# Patient Record
Sex: Male | Born: 1980 | Race: White | Hispanic: Yes | Marital: Married | Smoking: Current some day smoker
Health system: Southern US, Community
[De-identification: ages and names within clinical notes are randomized; demographics above are authoritative.]

## PROBLEM LIST (undated history)

## (undated) DIAGNOSIS — E785 Hyperlipidemia, unspecified: Secondary | ICD-10-CM

## (undated) HISTORY — DX: Hyperlipidemia, unspecified: E78.5

---
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
## 2005-05-19 ENCOUNTER — Emergency Department (HOSPITAL_COMMUNITY): Admission: EM | Admit: 2005-05-19 | Discharge: 2005-05-19 | Payer: Self-pay | Admitting: Emergency Medicine

## 2005-10-25 ENCOUNTER — Emergency Department (HOSPITAL_COMMUNITY): Admission: EM | Admit: 2005-10-25 | Discharge: 2005-10-25 | Payer: Self-pay | Admitting: Emergency Medicine

## 2005-12-25 ENCOUNTER — Emergency Department (HOSPITAL_COMMUNITY): Admission: EM | Admit: 2005-12-25 | Discharge: 2005-12-25 | Payer: Self-pay | Admitting: Family Medicine

## 2006-01-31 ENCOUNTER — Emergency Department (HOSPITAL_COMMUNITY): Admission: EM | Admit: 2006-01-31 | Discharge: 2006-02-01 | Payer: Self-pay | Admitting: Emergency Medicine

## 2006-02-02 ENCOUNTER — Emergency Department (HOSPITAL_COMMUNITY): Admission: EM | Admit: 2006-02-02 | Discharge: 2006-02-02 | Payer: Self-pay | Admitting: Emergency Medicine

## 2006-03-27 ENCOUNTER — Emergency Department (HOSPITAL_COMMUNITY): Admission: EM | Admit: 2006-03-27 | Discharge: 2006-03-27 | Payer: Self-pay | Admitting: Emergency Medicine

## 2007-01-06 ENCOUNTER — Emergency Department (HOSPITAL_COMMUNITY): Admission: EM | Admit: 2007-01-06 | Discharge: 2007-01-06 | Payer: Self-pay | Admitting: Emergency Medicine

## 2007-02-09 ENCOUNTER — Emergency Department (HOSPITAL_COMMUNITY): Admission: EM | Admit: 2007-02-09 | Discharge: 2007-02-09 | Payer: Self-pay | Admitting: Family Medicine

## 2007-03-11 ENCOUNTER — Emergency Department (HOSPITAL_COMMUNITY): Admission: EM | Admit: 2007-03-11 | Discharge: 2007-03-11 | Payer: Self-pay | Admitting: Emergency Medicine

## 2007-03-15 ENCOUNTER — Emergency Department (HOSPITAL_COMMUNITY): Admission: EM | Admit: 2007-03-15 | Discharge: 2007-03-15 | Payer: Self-pay | Admitting: Family Medicine

## 2007-06-06 ENCOUNTER — Emergency Department (HOSPITAL_COMMUNITY): Admission: EM | Admit: 2007-06-06 | Discharge: 2007-06-06 | Payer: Self-pay | Admitting: Emergency Medicine

## 2008-02-21 ENCOUNTER — Emergency Department (HOSPITAL_COMMUNITY): Admission: EM | Admit: 2008-02-21 | Discharge: 2008-02-21 | Payer: Self-pay | Admitting: Emergency Medicine

## 2008-08-12 ENCOUNTER — Emergency Department (HOSPITAL_COMMUNITY): Admission: EM | Admit: 2008-08-12 | Discharge: 2008-08-12 | Payer: Self-pay | Admitting: Emergency Medicine

## 2009-10-05 ENCOUNTER — Emergency Department (HOSPITAL_COMMUNITY): Admission: EM | Admit: 2009-10-05 | Discharge: 2009-10-05 | Payer: Self-pay | Admitting: Internal Medicine

## 2009-12-20 ENCOUNTER — Emergency Department (HOSPITAL_COMMUNITY): Admission: EM | Admit: 2009-12-20 | Discharge: 2009-12-20 | Payer: Self-pay | Admitting: Emergency Medicine

## 2011-01-19 IMAGING — CR DG KNEE COMPLETE 4+V*R*
4 series · 4 of 4 positions shown · non-contrast
Comparison: None.

CLINICAL DATA: 29-year-old male with twisting right knee injury
yesterday.  Medial pain.

RIGHT KNEE - COMPLETE 4+ VIEW

[t knee ap right]
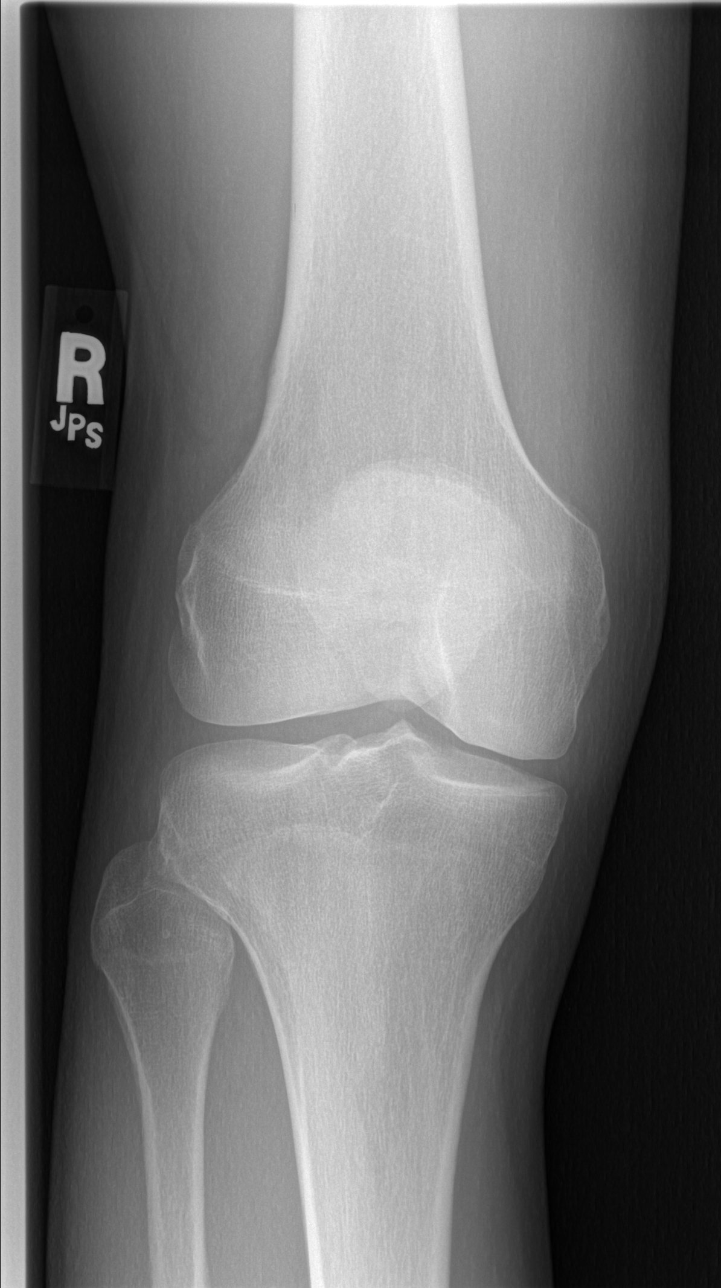

[t knee oblique right (1 of 2)]
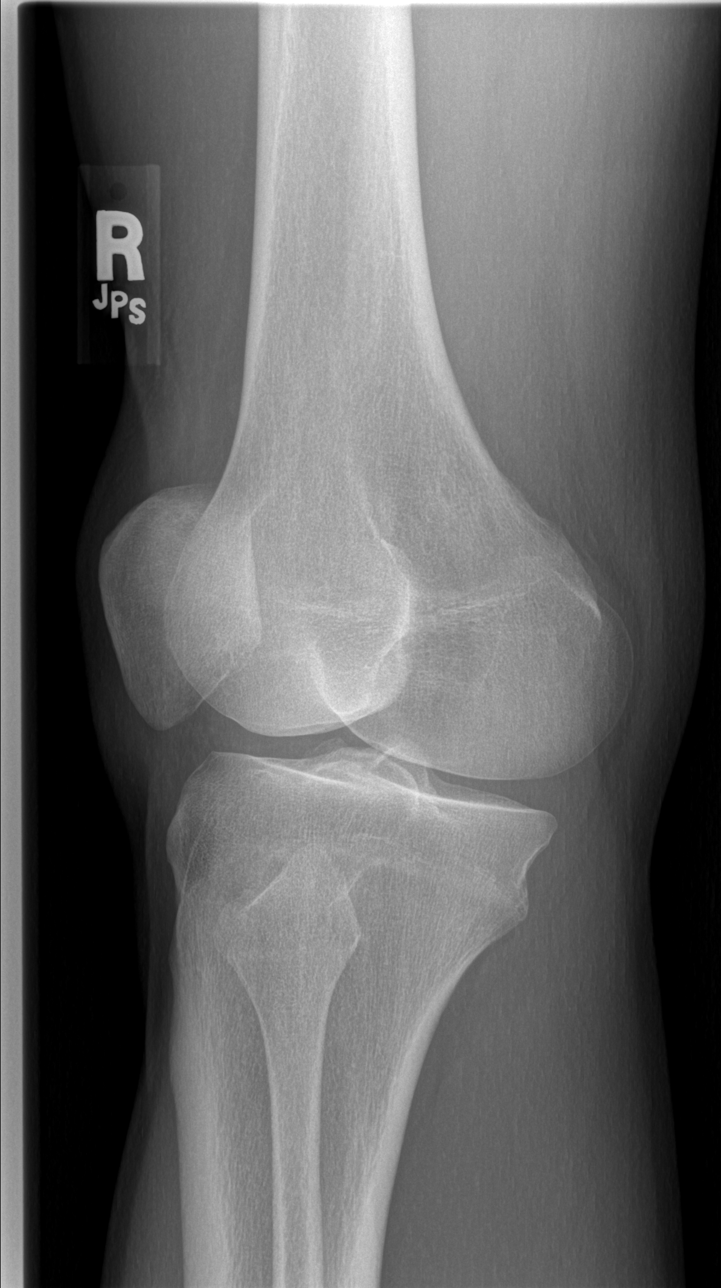

[t knee oblique right (2 of 2)]
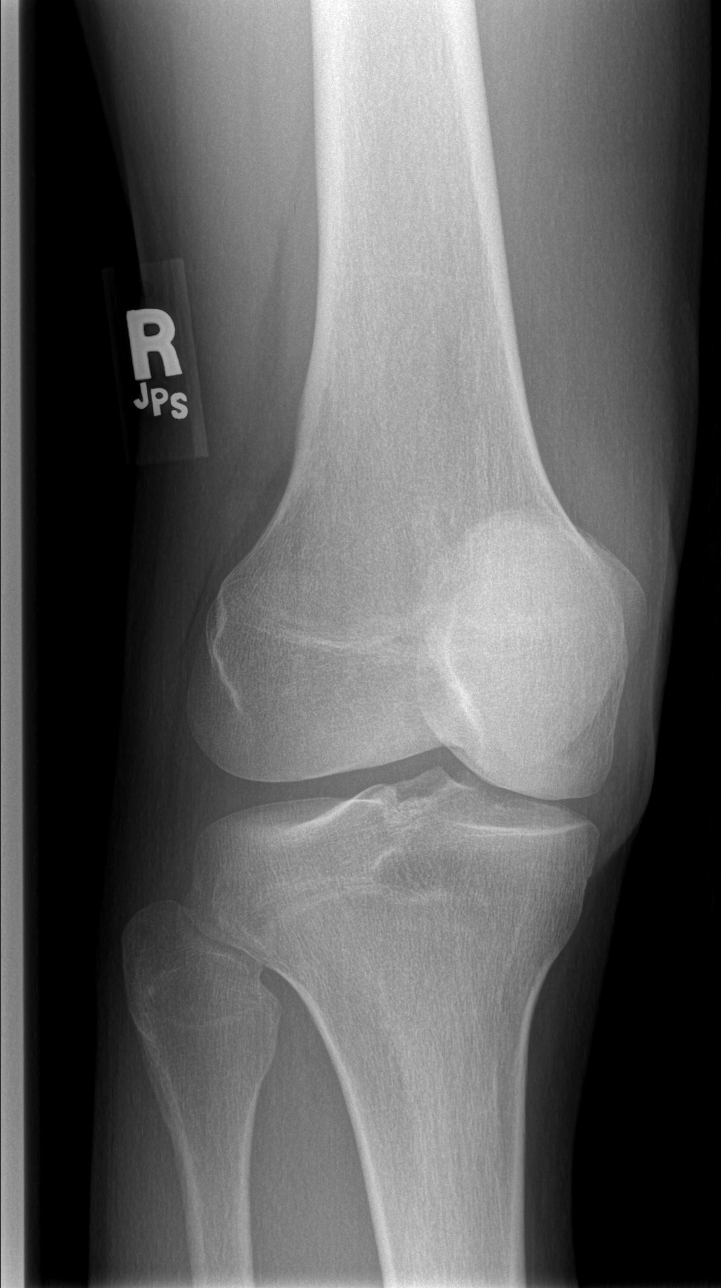

[t knee lat right]
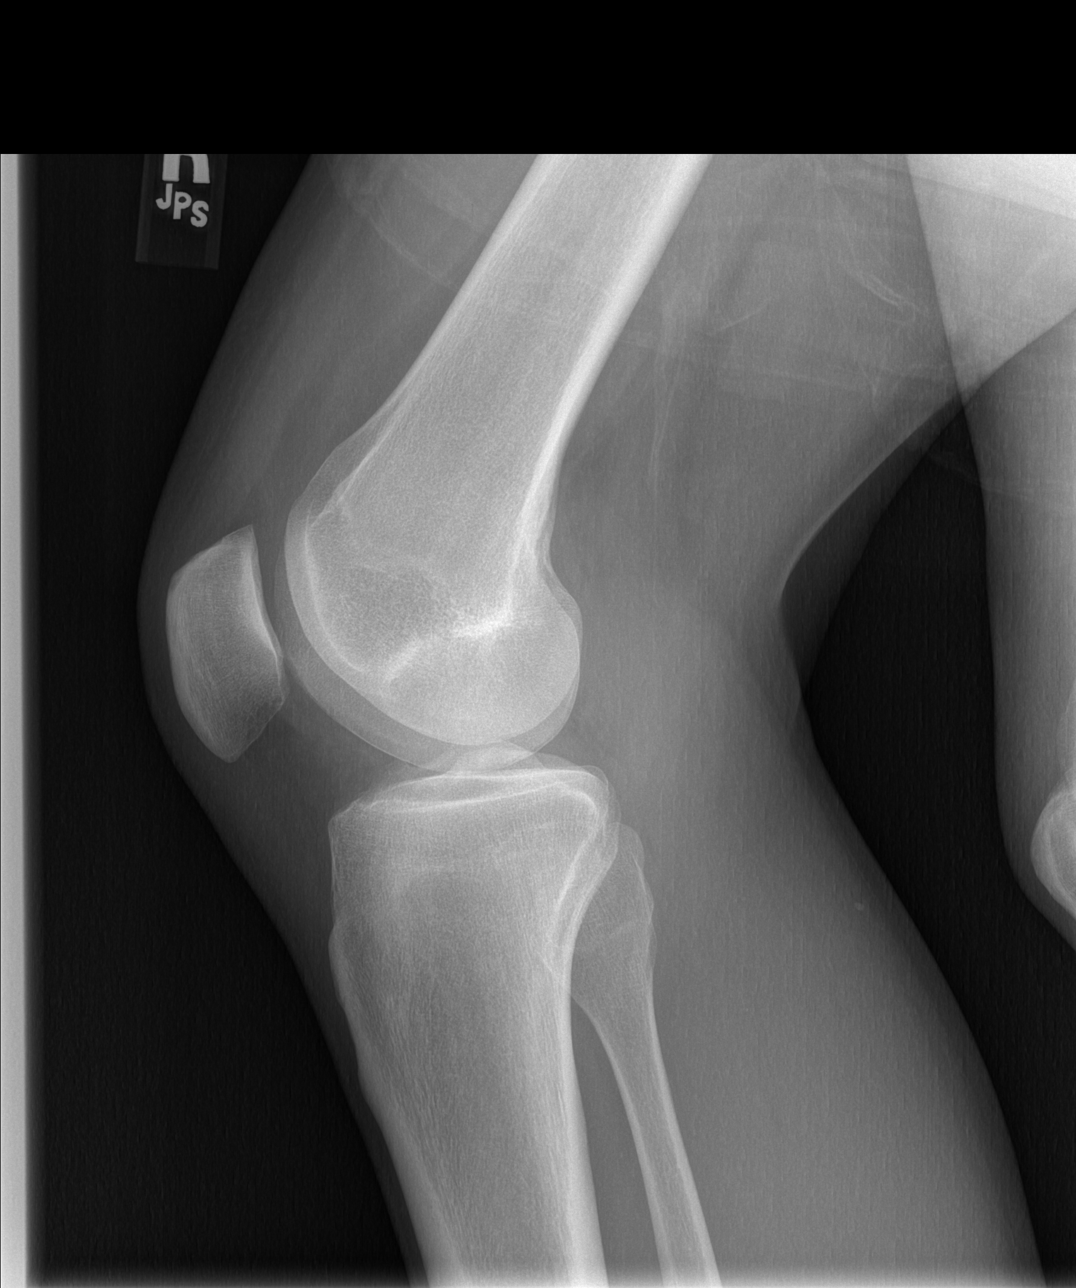

[4 of 4 positions shown; findings below may reference images not displayed]

FINDINGS: Trace suprapatellar effusion. Bone mineralization is
within normal limits. Normal joint spaces.  No acute fracture.
IMPRESSION: Trace joint effusion.  No acute fracture or dislocation identified
about the right knee.

## 2018-04-22 ENCOUNTER — Ambulatory Visit: Payer: Self-pay | Admitting: Family Medicine

## 2018-04-23 ENCOUNTER — Telehealth: Payer: Self-pay | Admitting: Family Medicine

## 2018-04-23 ENCOUNTER — Ambulatory Visit: Payer: BC Managed Care – PPO | Admitting: Family Medicine

## 2018-04-23 ENCOUNTER — Encounter: Payer: Self-pay | Admitting: Family Medicine

## 2018-04-23 VITALS — BP 114/72 | HR 71 | Temp 98.4°F | Ht 75.0 in | Wt 228.5 lb

## 2018-04-23 DIAGNOSIS — Z13220 Encounter for screening for lipoid disorders: Secondary | ICD-10-CM | POA: Diagnosis not present

## 2018-04-23 DIAGNOSIS — Z131 Encounter for screening for diabetes mellitus: Secondary | ICD-10-CM

## 2018-04-23 DIAGNOSIS — Z720 Tobacco use: Secondary | ICD-10-CM

## 2018-04-23 DIAGNOSIS — S335XXA Sprain of ligaments of lumbar spine, initial encounter: Secondary | ICD-10-CM

## 2018-04-23 DIAGNOSIS — Z833 Family history of diabetes mellitus: Secondary | ICD-10-CM

## 2018-04-23 DIAGNOSIS — F4321 Adjustment disorder with depressed mood: Secondary | ICD-10-CM | POA: Diagnosis not present

## 2018-04-23 DIAGNOSIS — Z807 Family history of other malignant neoplasms of lymphoid, hematopoietic and related tissues: Secondary | ICD-10-CM | POA: Diagnosis not present

## 2018-04-23 DIAGNOSIS — F4320 Adjustment disorder, unspecified: Secondary | ICD-10-CM | POA: Insufficient documentation

## 2018-04-23 DIAGNOSIS — Z83438 Family history of other disorder of lipoprotein metabolism and other lipidemia: Secondary | ICD-10-CM

## 2018-04-23 LAB — CBC WITH DIFFERENTIAL/PLATELET
BASOS ABS: 0.1 10*3/uL (ref 0.0–0.1)
Basophils Relative: 0.8 % (ref 0.0–3.0)
Eosinophils Absolute: 0.3 10*3/uL (ref 0.0–0.7)
Eosinophils Relative: 4 % (ref 0.0–5.0)
HEMATOCRIT: 40.1 % (ref 39.0–52.0)
Hemoglobin: 13.4 g/dL (ref 13.0–17.0)
LYMPHS PCT: 23.8 % (ref 12.0–46.0)
Lymphs Abs: 1.9 10*3/uL (ref 0.7–4.0)
MCHC: 33.4 g/dL (ref 30.0–36.0)
MCV: 94.4 fl (ref 78.0–100.0)
MONOS PCT: 9.9 % (ref 3.0–12.0)
Monocytes Absolute: 0.8 10*3/uL (ref 0.1–1.0)
NEUTROS ABS: 4.8 10*3/uL (ref 1.4–7.7)
Neutrophils Relative %: 61.5 % (ref 43.0–77.0)
Platelets: 339 10*3/uL (ref 150.0–400.0)
RBC: 4.25 Mil/uL (ref 4.22–5.81)
RDW: 13.2 % (ref 11.5–15.5)
WBC: 7.9 10*3/uL (ref 4.0–10.5)

## 2018-04-23 LAB — LIPID PANEL
Cholesterol: 259 mg/dL — ABNORMAL HIGH (ref 0–200)
HDL: 55.5 mg/dL (ref >39.00)
NONHDL: 203.99
TRIGLYCERIDES: 208 mg/dL — AB (ref 0.0–149.0)
Total CHOL/HDL Ratio: 5
VLDL: 41.6 mg/dL — ABNORMAL HIGH (ref 0.0–40.0)

## 2018-04-23 LAB — LDL CHOLESTEROL, DIRECT: Direct LDL: 155 mg/dL

## 2018-04-23 LAB — HEMOGLOBIN A1C: HEMOGLOBIN A1C: 5.8 % (ref 4.6–6.5)

## 2018-04-23 NOTE — Progress Notes (Addendum)
Subjective:     Hector StallCarlos A Murphy is a 38 y.o. 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 happened about 1 or so weeks ago. he remembers picking up a box and maybe that has an impact.)     Back Pain  This is a new problem. The current episode started 1 to 4 weeks ago. The problem occurs intermittently. The problem has been gradually improving since onset. The pain is present in the lumbar spine. The quality of the pain is described as shooting and stabbing. The pain does not radiate. The pain is at a severity of 7/10. The symptoms are aggravated by position (lifting). Pertinent negatives include no abdominal pain, bladder incontinence, bowel incontinence, chest pain, fever, leg pain, numbness, paresis, tingling or weakness. He has tried NSAIDs, heat and ice (pain patch) for the symptoms. The treatment provided no relief.   Occurred when picking up a box - and pain was so severe he had to drop the item he was picking  #Grief reaction - lost his brother in September - exercising - is spending a lot of time with niece/nephew  - symptoms worse when not keeping busy - feels it is getting better overall   Review of Systems  Constitutional: Negative for chills and fever.  HENT: Negative.   Eyes: Negative.   Respiratory: Negative for cough, shortness of breath and wheezing.   Cardiovascular: Negative for chest pain.  Gastrointestinal: Negative for abdominal pain and bowel incontinence.  Endocrine: Negative.   Genitourinary: Negative.  Negative for bladder incontinence.  Musculoskeletal: Positive for back pain.  Skin: Negative.   Allergic/Immunologic: Negative.   Neurological: Negative for tingling, weakness and numbness.  Hematological: Negative.   Psychiatric/Behavioral: Negative.    PMH, PSH, FamHx and social hx reviewed and updated in Epic  Fmhx: diabetes in Mom and Dad  Social History     Tobacco Use  Smoking Status Current Some Day Smoker  . Packs/day: 0.25  . Years: 20.00  . Pack years: 5.00  . Types: Cigarettes  Smokeless Tobacco Never Used  Tobacco Comment   smokes about 2 to 3 cigarettes a day when he does smoke        Objective:    BP Readings from Last 3 Encounters:  04/23/18 114/72   Wt Readings from Last 3 Encounters:  04/23/18 228 lb 8 oz (103.6 kg)    BP 114/72   Pulse 71   Temp 98.4 F (36.9 C)   Ht 6\' 3"  (1.905 m)   Wt 228 lb 8 oz (103.6 kg)   SpO2 95%   BMI 28.56 kg/m    Physical Exam Constitutional:      Appearance: Normal appearance. He is not ill-appearing or diaphoretic.  HENT:     Right Ear: External ear normal.     Left Ear: External ear normal.     Nose: Nose normal.  Eyes:     General: No scleral icterus.    Extraocular Movements: Extraocular movements intact.     Conjunctiva/sclera: Conjunctivae normal.  Neck:     Musculoskeletal: Neck supple.  Cardiovascular:     Rate and Rhythm: Normal rate.  Pulmonary:     Effort: Pulmonary effort is normal.  Musculoskeletal:     Comments: Back:  Inspection: no erythema, no swelling Palpation: TTP along the L-spine, no paraspinous TTP  ROM: normal, though pain with flexion/extension and with lateral flexion Strength:  normal LE strength Neg straight leg raise  Skin:    General: Skin is warm and dry.  Neurological:     Mental Status: He is alert. Mental status is at baseline.     Sensory: No sensory deficit.     Motor: No weakness.     Deep Tendon Reflexes:     Reflex Scores:      Patellar reflexes are 2+ on the right side and 2+ on the left side.      Achilles reflexes are 2+ on the right side and 2+ on the left side. Psychiatric:        Mood and Affect: Mood normal.     Depression screen [facility] 2/9 04/23/2018  Decreased Interest 3  Down, Depressed, Hopeless 1  PHQ - 2 Score 4  Altered sleeping 3  Tired, decreased energy 2  Change in appetite 0  Feeling bad or  failure about yourself  2  Trouble concentrating 3  Moving slowly or fidgety/restless 2  Suicidal thoughts 0  PHQ-9 Score 16         Assessment & Plan:   Problem List Items Addressed This Visit      Musculoskeletal and Integument   Sprain, low back - Primary    Some tenderness over the L-spine but given only 1 week of symptoms discussed continued NSAIDs and home PT. Follow-up if not improved and will plan to do imaging in 6 weeks. Suggested PT given job, but pt declined        Other   Grief reaction    PHQ-9 elevated today. Though patient notes it is intermittent grief related to brothers sudden and fast death 2/2 to lymphoma. Using exercise and keeping busy to help with symptoms. Advised consideration for therapy but patient declined. Will continue to monitor.      Tobacco use    Smoking 3-5 cig/day. Not interested in quitting at this time.        Other Visit Diagnoses    Screening for hyperlipidemia       Relevant Orders   Lipid panel   Screening for diabetes mellitus       Relevant Orders   Hemoglobin A1c   Family history of lymphoma       Relevant Orders   CBC with Differential   Family history of diabetes mellitus (DM)       Family history of hyperlipidemia         Brother recently died from lymphoma which was discovered late. Discussed that this is less likely but given some anxiety and history of late cancer diagnosis will screen blood work to evaluate for blood pathology.    Return in about 5 weeks (around 05/28/2018) for if back not improved.  Lynnda Child, MD

## 2018-04-23 NOTE — Assessment & Plan Note (Signed)
PHQ-9 elevated today. Though patient notes it is intermittent grief related to brothers sudden and fast death 2/2 to lymphoma. Using exercise and keeping busy to help with symptoms. Advised consideration for therapy but patient declined. Will continue to monitor.

## 2018-04-23 NOTE — Patient Instructions (Addendum)
Low Back pain - Take Ibuprofen 800 mg up to 3 times a day instead of excedrin - Continue heat and ice - do rehab exercises below   Low Back Strain Rehab Ask your health care provider which exercises are safe for you. Do exercises exactly as told by your health care provider and adjust them as directed. It is normal to feel mild stretching, pulling, tightness, or discomfort as you do these exercises, but you should stop right away if you feel sudden pain or your pain gets worse. Do not begin these exercises until told by your health care provider. Stretching and range of motion exercises These exercises warm up your muscles and joints and improve the movement and flexibility of your back. These exercises also help to relieve pain, numbness, and tingling. Exercise A: Single knee to chest  1. Lie on your back on a firm surface with both legs straight. 2. Bend one of your knees. Use your hands to move your knee up toward your chest until you feel a gentle stretch in your lower back and buttock. ? Hold your leg in this position by holding onto the front of your knee. ? Keep your other leg as straight as possible. 3. Hold for __________ seconds. 4. Slowly return to the starting position. 5. Repeat with your other leg. Repeat __________ times. Complete this exercise __________ times a day. Exercise B: Prone extension on elbows  1. Lie on your abdomen on a firm surface. 2. Prop yourself up on your elbows. 3. Use your arms to help lift your chest up until you feel a gentle stretch in your abdomen and your lower back. ? This will place some of your body weight on your elbows. If this is uncomfortable, try stacking pillows under your chest. ? Your hips should stay down, against the surface that you are lying on. Keep your hip and back muscles relaxed. 4. Hold for __________ seconds. 5. Slowly relax your upper body and return to the starting position. Repeat __________ times. Complete this exercise  __________ times a day. Strengthening exercises These exercises build strength and endurance in your back. Endurance is the ability to use your muscles for a long time, even after they get tired. Exercise C: Pelvic tilt 1. Lie on your back on a firm surface. Bend your knees and keep your feet flat. 2. Tense your abdominal muscles. Tip your pelvis up toward the ceiling and flatten your lower back into the floor. ? To help with this exercise, you may place a small towel under your lower back and try to push your back into the towel. 3. Hold for __________ seconds. 4. Let your muscles relax completely before you repeat this exercise. Repeat __________ times. Complete this exercise __________ times a day. Exercise D: Alternating arm and leg raises  1. Get on your hands and knees on a firm surface. If you are on a hard floor, you may want to use padding to cushion your knees, such as an exercise mat. 2. Line up your arms and legs. Your hands should be below your shoulders, and your knees should be below your hips. 3. Lift your left leg behind you. At the same time, raise your right arm and straighten it in front of you. ? Do not lift your leg higher than your hip. ? Do not lift your arm higher than your shoulder. ? Keep your abdominal and back muscles tight. ? Keep your hips facing the ground. ? Do not arch your back. ?  Keep your balance carefully, and do not hold your breath. 4. Hold for __________ seconds. 5. Slowly return to the starting position and repeat with your right leg and your left arm. Repeat __________ times. Complete this exercise __________times a day. Exercise J: Single leg lower with bent knees 1. Lie on your back on a firm surface. 2. Tense your abdominal muscles and lift your feet off the floor, one foot at a time, so your knees and hips are bent in an "L" shape (at about 90 degrees). ? Your knees should be over your hips and your lower legs should be parallel to the  floor. 3. Keeping your abdominal muscles tense and your knee bent, slowly lower one of your legs so your toe touches the ground. 4. Lift your leg back up to return to the starting position. ? Do not hold your breath. ? Do not let your back arch. Keep your back flat against the ground. 5. Repeat with your other leg. Repeat __________ times. Complete this exercise __________ times a day. Posture and body mechanics  Body mechanics refers to the movements and positions of your body while you do your daily activities. Posture is part of body mechanics. Good posture and healthy body mechanics can help to relieve stress in your body's tissues and joints. Good posture means that your spine is in its natural S-curve position (your spine is neutral), your shoulders are pulled back slightly, and your head is not tipped forward. The following are general guidelines for applying improved posture and body mechanics to your everyday activities. Standing   When standing, keep your spine neutral and your feet about hip-width apart. Keep a slight bend in your knees. Your ears, shoulders, and hips should line up.  When you do a task in which you stand in one place for a long time, place one foot up on a stable object that is 2-4 inches (5-10 cm) high, such as a footstool. This helps keep your spine neutral. Sitting   When sitting, keep your spine neutral and keep your feet flat on the floor. Use a footrest, if necessary, and keep your thighs parallel to the floor. Avoid rounding your shoulders, and avoid tilting your head forward.  When working at a desk or a computer, keep your desk at a height where your hands are slightly lower than your elbows. Slide your chair under your desk so you are close enough to maintain good posture.  When working at a computer, place your monitor at a height where you are looking straight ahead and you do not have to tilt your head forward or downward to look at the  screen. Resting   When lying down and resting, avoid positions that are most painful for you.  If you have pain with activities such as sitting, bending, stooping, or squatting (flexion-based activities), lie in a position in which your body does not bend very much. For example, avoid curling up on your side with your arms and knees near your chest (fetal position).  If you have pain with activities such as standing for a long time or reaching with your arms (extension-based activities), lie with your spine in a neutral position and bend your knees slightly. Try the following positions: ? Lying on your side with a pillow between your knees. ? Lying on your back with a pillow under your knees. Lifting   When lifting objects, keep your feet at least shoulder-width apart and tighten your abdominal muscles.  Bend your  knees and hips and keep your spine neutral. It is important to lift using the strength of your legs, not your back. Do not lock your knees straight out.  Always ask for help to lift heavy or awkward objects. This information is not intended to replace advice given to you by your health care provider. Make sure you discuss any questions you have with your health care provider. Document Released: 03/06/2005 Document Revised: 11/11/2015 Document Reviewed: 12/16/2014 Elsevier Interactive Patient Education  2019 [employer].

## 2018-04-23 NOTE — Assessment & Plan Note (Signed)
Some tenderness over the L-spine but given only 1 week of symptoms discussed continued NSAIDs and home PT. Follow-up if not improved and will plan to do imaging in 6 weeks. Suggested PT given job, but pt declined

## 2018-04-23 NOTE — Assessment & Plan Note (Signed)
Smoking 3-5 cig/day. Not interested in quitting at this time.

## 2018-04-23 NOTE — Telephone Encounter (Signed)
Pt would like to be called with lab results and also put in my chart

## 2018-04-24 NOTE — Telephone Encounter (Signed)
Done, see result note 

## 2021-05-31 ENCOUNTER — Telehealth: Payer: Self-pay | Admitting: Family Medicine

## 2021-05-31 NOTE — Telephone Encounter (Signed)
This patient saw you as a new patient in 2.4.20, his wife currently sees you, Hector Murphy Case ? ?Can he be scheduled as an office visit (sooner) or does he need to go in a new patient slot? ? ?Please advise so we can call the patient to schedule ? ? ? ?

## 2021-05-31 NOTE — Telephone Encounter (Signed)
Does not need to wait for new patient visit.  Okay to schedule for follow-up. ?

## 2021-06-14 ENCOUNTER — Encounter: Payer: Self-pay | Admitting: *Deleted

## 2021-06-14 ENCOUNTER — Encounter: Payer: Self-pay | Admitting: Family Medicine

## 2021-06-14 ENCOUNTER — Other Ambulatory Visit: Payer: Self-pay

## 2021-06-14 ENCOUNTER — Ambulatory Visit (INDEPENDENT_AMBULATORY_CARE_PROVIDER_SITE_OTHER): Payer: BC Managed Care – PPO | Admitting: Family Medicine

## 2021-06-14 VITALS — BP 140/82 | HR 69 | Temp 97.4°F | Ht 76.0 in | Wt 200.1 lb

## 2021-06-14 DIAGNOSIS — Z131 Encounter for screening for diabetes mellitus: Secondary | ICD-10-CM

## 2021-06-14 DIAGNOSIS — Z3009 Encounter for other general counseling and advice on contraception: Secondary | ICD-10-CM

## 2021-06-14 DIAGNOSIS — Z720 Tobacco use: Secondary | ICD-10-CM

## 2021-06-14 DIAGNOSIS — G4709 Other insomnia: Secondary | ICD-10-CM | POA: Insufficient documentation

## 2021-06-14 DIAGNOSIS — N529 Male erectile dysfunction, unspecified: Secondary | ICD-10-CM

## 2021-06-14 DIAGNOSIS — E782 Mixed hyperlipidemia: Secondary | ICD-10-CM | POA: Insufficient documentation

## 2021-06-14 LAB — COMPREHENSIVE METABOLIC PANEL
ALT: 12 U/L (ref 0–53)
AST: 12 U/L (ref 0–37)
Albumin: 4.7 g/dL (ref 3.5–5.2)
Alkaline Phosphatase: 50 U/L (ref 39–117)
BUN: 19 mg/dL (ref 6–23)
CO2: 30 mEq/L (ref 19–32)
Calcium: 9.5 mg/dL (ref 8.4–10.5)
Chloride: 101 mEq/L (ref 96–112)
Creatinine, Ser: 0.86 mg/dL (ref 0.40–1.50)
GFR: 107.88 mL/min (ref >60.00)
Glucose, Bld: 100 mg/dL — ABNORMAL HIGH (ref 70–99)
Potassium: 4.6 mEq/L (ref 3.5–5.1)
Sodium: 139 mEq/L (ref 135–145)
Total Bilirubin: 0.3 mg/dL (ref 0.2–1.2)
Total Protein: 6.9 g/dL (ref 6.0–8.3)

## 2021-06-14 LAB — LDL CHOLESTEROL, DIRECT: Direct LDL: 140 mg/dL

## 2021-06-14 LAB — LIPID PANEL
Cholesterol: 264 mg/dL — ABNORMAL HIGH (ref 0–200)
HDL: 68.7 mg/dL (ref >39.00)
NonHDL: 195.5
Total CHOL/HDL Ratio: 4
Triglycerides: 207 mg/dL — ABNORMAL HIGH (ref 0.0–149.0)
VLDL: 41.4 mg/dL — ABNORMAL HIGH (ref 0.0–40.0)

## 2021-06-14 LAB — POCT GLYCOSYLATED HEMOGLOBIN (HGB A1C): Hemoglobin A1C: 5.1 % (ref 4.0–5.6)

## 2021-06-14 MED ORDER — SILDENAFIL CITRATE 50 MG PO TABS: 25.0000 mg | ORAL_TABLET | Freq: Every day | ORAL | 0 refills | Status: DC | PRN

## 2021-06-14 MED ORDER — HYDROXYZINE HCL 50 MG PO TABS: 50.0000 mg | ORAL_TABLET | Freq: Every evening | ORAL | 1 refills | Status: DC | PRN

## 2021-06-14 NOTE — Assessment & Plan Note (Signed)
Failing outpatient - melatonin and tylenol PM. Some mind-racing trigger. Sleep hygiene and trial of hydroxyzine.  ?

## 2021-06-14 NOTE — Assessment & Plan Note (Signed)
Discussed likely psychological - has 3 kids and very little time. But will trial viagra. Will also monitor BP at home. Discussed risks and ER precautions.  ?

## 2021-06-14 NOTE — Assessment & Plan Note (Signed)
Has quit drinking and lost weight. Re-check fasting today ?

## 2021-06-14 NOTE — Progress Notes (Signed)
? ?Subjective:  ? ?  ?Hector Murphy is a 41 y.o. male presenting for Erectile Dysfunction ?  ? ? ?HPI ? ?#Erectile dysfunction ?- smoking 5-6 cig/week ?- trouble getting an erection ?- started over the last year ?- has been with his partner for 10 years  ?- relationship still good - no marital stress ?- 3 children at home  ?- hard to get alone time - some urgency with intimacy ?- is getting morning erections ?- no hair loss ?- energy level is the same ?- no depression ?- no change in testicle size ? ?Still eating regularly ? 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 ?#insomnia ?- taking tylenol pm ?- melatonin only works for  ? ?Review of Systems ? ? ?Social History  ? ?Tobacco Use  ?Smoking Status Some Days  ? Packs/day: 0.25  ? Years: 20.00  ? Pack years: 5.00  ? Types: Cigarettes  ?Smokeless Tobacco Never  ?Tobacco Comments  ? smokes about 2 to 3 cigarettes a day when he does smoke  ? ? ? ?   ?Objective:  ?  ?BP Readings from Last 3 Encounters:  ?06/14/21 140/82  ?04/23/18 114/72  ? ?Wt Readings from Last 3 Encounters:  ?06/14/21 200 lb 2 oz (90.8 kg)  ?04/23/18 228 lb 8 oz (103.6 kg)  ? ? ?BP 140/82   Pulse 69   Temp (!) 97.4 ?F (36.3 ?C) (Oral)   Ht 6\' 4"  (1.93 m)   Wt 200 lb 2 oz (90.8 kg)   SpO2 99%   BMI 24.36 kg/m?  ? ? ?Physical Exam ?Constitutional:   ?   Appearance: Normal appearance. He is not ill-appearing or diaphoretic.  ?HENT:  ?   Right Ear: External ear normal.  ?   Left Ear: External ear normal.  ?   Nose: Nose normal.  ?Eyes:  ?   General: No scleral icterus. ?   Extraocular Movements: Extraocular movements intact.  ?   Conjunctiva/sclera: Conjunctivae normal.  ?Cardiovascular:  ?   Rate and Rhythm: Normal rate and regular rhythm.  ?   Heart sounds: No murmur heard. ?Pulmonary:  ?   Effort: Pulmonary effort is normal. No respiratory distress.  ?   Breath sounds: Normal breath sounds. No wheezing.  ?Musculoskeletal:  ?   Cervical back: Neck supple.  ?Skin: ?    General: Skin is warm and dry.  ?Neurological:  ?   Mental Status: He is alert. Mental status is at baseline.  ?Psychiatric:     ?   Mood and Affect: Mood normal.  ? ? ? ? ? ?   ?Assessment & Plan:  ? ?Problem List Items Addressed This Visit   ? ?  ? Other  ? Tobacco use  ?  Encouraged continued cessation.  ?  ?  ? Relevant Orders  ? Lipid panel  ? Comprehensive metabolic panel  ? Mixed hyperlipidemia  ?  Has quit drinking and lost weight. Re-check fasting today ?  ?  ? Relevant Medications  ? sildenafil (VIAGRA) 50 MG tablet  ? Other Relevant Orders  ? Lipid panel  ? Erectile dysfunction  ?  Discussed likely psychological - has 3 kids and very little time. But will trial viagra. Will also monitor BP at home. Discussed risks and ER precautions.  ?  ?  ? Relevant Medications  ? sildenafil (VIAGRA) 50 MG tablet  ? Other insomnia  ?  Failing outpatient - melatonin  and tylenol PM. Some mind-racing trigger. Sleep hygiene and trial of hydroxyzine.  ?  ?  ? ?Other Visit Diagnoses   ? ? Vasectomy evaluation    -  Primary  ? Relevant Orders  ? Ambulatory referral to Urology  ? Screening for diabetes mellitus      ? Relevant Orders  ? POCT glycosylated hemoglobin (Hb A1C) (Completed)  ? ?  ? ? ? ?Return in about 6 months (around 12/15/2021) for annual physical/blood pressure check. ? ?Lynnda Child, MD ? ?This visit occurred during the SARS-CoV-2 public health emergency.  Safety protocols were in place, including screening questions prior to the visit, additional usage of staff PPE, and extensive cleaning of exam room while observing appropriate contact time as indicated for disinfecting solutions.  ? ?

## 2021-06-14 NOTE — Assessment & Plan Note (Signed)
-   Encouraged continued cessation

## 2021-06-14 NOTE — Patient Instructions (Addendum)
Insomnia ?- Take hydroxyzine 50-100 mg at night ?- return in 1 month if not working ? ?Sleep hygiene checklist: ?1. Avoid naps during the day ?2. Avoid stimulants such as caffeine and nicotine. Avoid bedtime alcohol (it can speed onset of sleep but the body's metabolism can cause awakenings). At least 2 hours before bedtime ?3. All forms of exercise help ensure sound sleep - limit vigorous exercise to morning or late afternoon ?4. Avoid food too close to bedtime including chocolate (which contains caffeine) ?5. Soak up natural light ?6. Establish regular bedtime routine. ?7. Associate bed with sleep - avoid TV, computer or phone, reading while in bed. ?8. Ensure pleasant, relaxing sleep environment - quiet, dark, cool room. ? ?Good Sleep Hygiene Habits ?-- Got to bed and wake up within an hour of the same time every day ?-- Avoid bright screens (from laptop, phone, TV) within at least 30 minutes before bed. The "blue light" supresses the sleep hormone melatonin and the content may stimulate as well ?-- Maintain a quiet and dark sleep environment (blackout curtains, turn on a fan or white noise to block out disruptive sounds) ?-- Practicing relaxing activites before bed (taking a shower, reading a book, journaling, meditation app) ?-- To quiet a busy mind -- consider journaling before bed (jotting down reminders, worry thoughts, as well as positive things like a gratitude list) ? ? ?Begin a Mindfulness/Meditation practice -- this can take a little as 3 minutes ?-- You can find resources in books ?-- Or you can download apps like  ?---- Headspace App (which currently has free content called "Weathering the Storm") ?---- Calm (which has a few free options)  ?---- Insignt Timer ?---- Stop, Breathe & Think ? ?# With each of these Apps - you should decline the "start free trial" offer and as you search through the App should be able to access some of their free content. You can also chose to pay for the content if you  find one that works well for you.  ? ?# Many of them also offer sleep specific content which may help with insomnia ? ? ? ?Viagra ?- start with 25 mg ?- increase by 25 mg to effective dose ?- Check GoodRx for coupons ? ?Your blood pressure high.  ? ?High blood pressure increases your risk for heart attack and stroke.  ? ? ?Please check your blood pressure 2-4 times a week.  ? ?To check your blood pressure ?1) Sit in a quiet and relaxed place for 5 minutes ?2) Make sure your feet are flat on the ground ?3) Consider checking first thing in the morning  ? ?Normal blood pressure is less than 140/90 ?Ideally you blood pressure should be around 120/80 ? ?Other ways you can reduce your blood pressure:  ?1) Regular exercise ?-- Try to get 150 minutes (30 minutes, 5 days a week) of moderate to vigorous aerobic excercise ?-- Examples: brisk walking (2.5 miles per hour), water aerobics, dancing, gardening, tennis, biking slower than 10 miles per hour ?2) DASH Diet - low fat meats, more fresh fruits and vegetables, whole grains, low salt ?3) Quit smoking if you smoke ?4) Loose 5-10% of your body weight ? ?

## 2021-11-03 ENCOUNTER — Other Ambulatory Visit: Payer: Self-pay | Admitting: Family Medicine

## 2021-11-03 DIAGNOSIS — N529 Male erectile dysfunction, unspecified: Secondary | ICD-10-CM

## 2021-11-04 NOTE — Telephone Encounter (Signed)
Patient scheduled.

## 2021-11-06 ENCOUNTER — Other Ambulatory Visit: Payer: Self-pay | Admitting: Family Medicine

## 2021-11-06 DIAGNOSIS — N529 Male erectile dysfunction, unspecified: Secondary | ICD-10-CM

## 2021-12-13 ENCOUNTER — Encounter: Payer: Self-pay | Admitting: Family Medicine

## 2021-12-13 ENCOUNTER — Telehealth: Payer: Self-pay

## 2021-12-13 NOTE — Telephone Encounter (Signed)
[facility] - Client Nonclinical Telephone Record  [facility] Client [facility] - Client Client Site [place] Provider Waunita Schooner- MD Contact Type Call Who Is Calling Patient / Member / Family / Caregiver Caller Name [place] Phone Number [phone] Patient Name Hector Murphy Patient DOB 20-Dec-1980 Call Type Message Only Information Provided Reason for Call Request to Reschedule Office Appointment Initial Comment Caller states he would like to reschedule his appt. Patient request to speak to RN No Disp. Time Disposition Final User 12/13/2021 8:04:52 AM General Information Provided Yes Dara Hoyer Call Closed By: Dara Hoyer Transaction Date/Time: 12/13/2021 8:03:01 AM (ET

## 2021-12-20 ENCOUNTER — Encounter: Payer: Self-pay | Admitting: Family

## 2021-12-27 ENCOUNTER — Encounter: Payer: Commercial Managed Care - HMO | Admitting: Family

## 2022-01-10 ENCOUNTER — Encounter: Payer: Commercial Managed Care - HMO | Admitting: Family

## 2022-05-22 ENCOUNTER — Other Ambulatory Visit: Payer: Self-pay | Admitting: Family

## 2022-05-22 ENCOUNTER — Telehealth: Payer: Self-pay | Admitting: Family Medicine

## 2022-05-22 DIAGNOSIS — N529 Male erectile dysfunction, unspecified: Secondary | ICD-10-CM

## 2022-05-22 MED ORDER — SILDENAFIL CITRATE 50 MG PO TABS: ORAL_TABLET | ORAL | 0 refills | Status: DC

## 2022-05-22 NOTE — Telephone Encounter (Signed)
Prescription Request  05/22/2022  LOV: 06/14/2021  What is the name of the medication or equipment? sildenafil (VIAGRA) 50 MG tablet   Have you contacted your pharmacy to request a refill? No   Which pharmacy would you like this sent to?  [facility] ([address] [place] [id number] [employer] [facility]) [place] [address] Phone: [phone] Fax: [phone]    Patient notified that their request is being sent to the clinical staff for review and that they should receive a response within 2 business days.   Please advise at Mobile [phone] (mobile)   Patient is scheduled for a TOC with Romilda Garret and he is completely out of the medication.

## 2022-05-23 ENCOUNTER — Encounter: Payer: Self-pay | Admitting: Nurse Practitioner

## 2022-05-23 NOTE — Telephone Encounter (Signed)
Refill has been sent in from [facility] until patient seen.

## 2022-07-13 ENCOUNTER — Ambulatory Visit: Payer: BC Managed Care – PPO | Admitting: Nurse Practitioner

## 2022-07-13 ENCOUNTER — Encounter: Payer: Self-pay | Admitting: Nurse Practitioner

## 2022-07-13 VITALS — BP 112/66 | HR 75 | Temp 98.8°F | Resp 16 | Ht 76.0 in | Wt 188.2 lb

## 2022-07-13 DIAGNOSIS — Z0001 Encounter for general adult medical examination with abnormal findings: Secondary | ICD-10-CM | POA: Insufficient documentation

## 2022-07-13 DIAGNOSIS — Z0000 Encounter for general adult medical examination without abnormal findings: Secondary | ICD-10-CM | POA: Insufficient documentation

## 2022-07-13 DIAGNOSIS — E782 Mixed hyperlipidemia: Secondary | ICD-10-CM | POA: Diagnosis not present

## 2022-07-13 DIAGNOSIS — F411 Generalized anxiety disorder: Secondary | ICD-10-CM | POA: Insufficient documentation

## 2022-07-13 DIAGNOSIS — Z23 Encounter for immunization: Secondary | ICD-10-CM

## 2022-07-13 DIAGNOSIS — N529 Male erectile dysfunction, unspecified: Secondary | ICD-10-CM

## 2022-07-13 DIAGNOSIS — Z720 Tobacco use: Secondary | ICD-10-CM

## 2022-07-13 DIAGNOSIS — G4709 Other insomnia: Secondary | ICD-10-CM

## 2022-07-13 MED ORDER — SILDENAFIL CITRATE 50 MG PO TABS: ORAL_TABLET | ORAL | 0 refills | Status: DC

## 2022-07-13 MED ORDER — HYDROXYZINE HCL 50 MG PO TABS: 50.0000 mg | ORAL_TABLET | Freq: Every evening | ORAL | 1 refills | Status: DC | PRN

## 2022-07-13 MED ORDER — BUSPIRONE HCL 5 MG PO TABS: 5.0000 mg | ORAL_TABLET | Freq: Two times a day (BID) | ORAL | 0 refills | Status: DC

## 2022-07-13 NOTE — Assessment & Plan Note (Signed)
PHQ-9 and GAD-7 administered in office.  Patient has HI/SI/AVH.  Will do buspirone 5 mg twice daily follow-up via MyChart within first month after starting medication.

## 2022-07-13 NOTE — Assessment & Plan Note (Addendum)
Has tried melatonin and Tylenol PM.  Melatonin causes weird dreams per his report he did well on hydroxyzine 40 remembers he would like a refill refill provided today.  Patient was instructed not to use hydroxyzine and Tylenol PM together

## 2022-07-13 NOTE — Assessment & Plan Note (Signed)
Discussed age-appropriate immunizations and screening exams.  Update tetanus vaccine today.  Patient is up-to-date on other vaccinations for his age.  Did review personal, social, family, surgical histories.  Patient is too young for CRC screening or PSA.  Patient was given information at discharge about preventative healthcare maintenance with anticipatory guidance for his age range.

## 2022-07-13 NOTE — Assessment & Plan Note (Signed)
History of the same is working on cutting down.  Urine microscopy to check for microscopic hematuria.

## 2022-07-13 NOTE — Assessment & Plan Note (Signed)
History of the same.  States he has not anything to eat today pending lipid panel

## 2022-07-13 NOTE — Assessment & Plan Note (Signed)
Patient has done well sildenafil in the past to 50 mg.  States it is effective he would like a refill.  Refill provided today

## 2022-07-13 NOTE — Progress Notes (Signed)
Established Patient Office Visit  Subjective   Patient ID: Hector Murphy, male    DOB: [birth year]  Age: 42 y.o. MRN: [id number]  Chief Complaint  Patient presents with   Establish Care    Transferring from Dr. Selena Batten    HPI Transfer of care: last seen 06/14/2021 by Gweneth Dimitri, MD    ED: states that it is getting and maintaining an erection. States that with  he had good results   Insomnia: states that he has been trying melatonin and tylenol pm. States that hydroxyzine did well. States that he would like a refill.  States when he tried melatonin it caused him to have weird dreams  for complete physical and follow up of chronic conditions.  Immunizations: -Tetanus: update today  -Influenza:  out of season  -Shingles: too young -Pneumonia: too young  Diet: Fair diet. States that he wil lhave one meal and a sandwich and snack. 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 States that he drinsk gaterade, water. Stopped drinking coffee  Exercise: No regular exercise. Employment   Eye exam: PRN  Dental exam: Completes semi-annually    Colonoscopy: Too young, currently average risk Lung Cancer Screening: Does not qualify  PSA: Too young, currently average risk   Sleep: states that he goes to bed around 9-10 pm. States that he gets up around 6a. Does not feel rested. Does not snore but spouse snores.       07/13/2022    2:39 PM 04/23/2018   11:11 AM  PHQ9 SCORE ONLY  PHQ-9 Total Score 9 16       07/13/2022    2:39 PM  GAD 7 : Generalized Anxiety Score  Nervous, Anxious, on Edge 3  Control/stop worrying 3  Worry too much - different things 3  Trouble relaxing 3  Restless 0  Easily annoyed or irritable 1  Afraid - awful might happen 3  Total GAD 7 Score 16  Anxiety Difficulty Very difficult         Review of Systems  Constitutional:  Negative for chills and fever.  Respiratory:  Negative for shortness of breath.   Cardiovascular:   Negative for chest pain and leg swelling.  Gastrointestinal:  Negative for abdominal pain, blood in stool, constipation, diarrhea, nausea and vomiting.       BM daily   Genitourinary:  Negative for dysuria and hematuria.  Neurological:  Negative for tingling and headaches.  Psychiatric/Behavioral:  Negative for hallucinations and suicidal ideas.       Objective:     BP 112/66   Pulse 75   Temp 98.8 F (37.1 C)   Resp 16   Ht  (1.93 m)   Wt 188 lb 4 oz (85.4 kg)   SpO2 99%   BMI 22.91 kg/m    Physical Exam Vitals and nursing note reviewed. Exam conducted with a chaperone present Tresa Endo Fiscal, CMA).  Constitutional:      Appearance: Normal appearance.  HENT:     Right Ear: Tympanic membrane, ear canal and external ear normal.     Left Ear: Tympanic membrane, ear canal and external ear normal.     Mouth/Throat:     Mouth: Mucous membranes are moist.     Pharynx: Oropharynx is clear.  Eyes:     Extraocular Movements: Extraocular movements intact.     Pupils: Pupils are equal, round, and reactive to light.  Cardiovascular:     Rate and Rhythm: Normal  rate and regular rhythm.     Pulses: Normal pulses.     Heart sounds: Normal heart sounds.  Pulmonary:     Effort: Pulmonary effort is normal.     Breath sounds: Normal breath sounds.  Abdominal:     General: Bowel sounds are normal. There is no distension.     Palpations: There is no mass.     Tenderness: There is no abdominal tenderness.     Hernia: No hernia is present. There is no hernia in the left inguinal area or right inguinal area.  Genitourinary:    Penis: Normal.      Testes: Normal.     Epididymis:     Right: Normal.     Left: Normal.  Musculoskeletal:     Right lower leg: No edema.     Left lower leg: No edema.  Lymphadenopathy:     Cervical: No cervical adenopathy.     Lower Body: No right inguinal adenopathy. No left inguinal adenopathy.  Skin:    General: Skin is warm.  Neurological:      General: No focal deficit present.     Mental Status: He is alert.     Deep Tendon Reflexes:     Reflex Scores:      Bicep reflexes are 2+ on the right side and 2+ on the left side.      Patellar reflexes are 2+ on the right side and 2+ on the left side.    Comments: Bilateral upper and lower extremity strength 5/5  Psychiatric:        Mood and Affect: Mood normal.        Behavior: Behavior normal.        Thought Content: Thought content normal.        Judgment: Judgment normal.      No results found for any visits on 07/13/22.    The 10-year ASCVD risk score (Arnett DK, et al., 2019) is: 3.8%    Assessment & Plan:   Problem List Items Addressed This Visit       Other   Tobacco use    History of the same is working on cutting down.  Urine microscopy to check for microscopic hematuria.      Relevant Orders   Urine Microscopic   Mixed hyperlipidemia    History of the same.  States he has not anything to eat today pending lipid panel      Relevant Medications   sildenafil (VIAGRA) 50 MG tablet   Other Relevant Orders   Lipid panel   Erectile dysfunction    Patient has done well sildenafil in the past to 50 mg.  States it is effective he would like a refill.  Refill provided today      Relevant Medications   sildenafil (VIAGRA) 50 MG tablet   Other insomnia    Has tried melatonin and Tylenol PM.  Melatonin causes weird dreams per his report he did well on hydroxyzine 40 remembers he would like a refill refill provided today.  Patient was instructed not to use hydroxyzine and Tylenol PM together      Relevant Medications   hydrOXYzine (ATARAX) 50 MG tablet   Other Relevant Orders   TSH   Preventative health care - Primary    Discussed age-appropriate immunizations and screening exams.  Update tetanus vaccine today.  Patient is up-to-date on other vaccinations for his age.  Did review personal, social, family, surgical histories.  Patient is too young for  CRC  screening or PSA.  Patient was given information at discharge about preventative healthcare maintenance with anticipatory guidance for his age range.      Relevant Orders   CBC   Comprehensive metabolic panel   TSH   GAD (generalized anxiety disorder)    PHQ-9 and GAD-7 administered in office.  Patient has HI/SI/AVH.  Will do buspirone 5 mg twice daily follow-up via MyChart within first month after starting medication.      Relevant Medications   hydrOXYzine (ATARAX) 50 MG tablet   busPIRone (BUSPAR) 5 MG tablet   Other Relevant Orders   TSH   Other Visit Diagnoses     Need for diphtheria-tetanus-pertussis (Tdap) vaccine       Relevant Orders   Tdap vaccine greater than or equal to 7yo IM       Return in about 1 year (around 07/13/2023) for CPE and Labs.    Audria Nine, NP

## 2022-07-13 NOTE — Patient Instructions (Signed)
Nice to see you today I will be in touch with the labs once I have reviewed them Follow up with me in 1 year for your next physical and labs.   

## 2022-07-14 LAB — COMPREHENSIVE METABOLIC PANEL
ALT: 11 U/L (ref 0–53)
AST: 15 U/L (ref 0–37)
Albumin: 4.5 g/dL (ref 3.5–5.2)
Alkaline Phosphatase: 55 U/L (ref 39–117)
BUN: 19 mg/dL (ref 6–23)
CO2: 28 mEq/L (ref 19–32)
Calcium: 9.2 mg/dL (ref 8.4–10.5)
Chloride: 103 mEq/L (ref 96–112)
Creatinine, Ser: 0.89 mg/dL (ref 0.40–1.50)
GFR: 105.96 mL/min (ref >60.00)
Glucose, Bld: 73 mg/dL (ref 70–99)
Potassium: 4.4 mEq/L (ref 3.5–5.1)
Sodium: 141 mEq/L (ref 135–145)
Total Bilirubin: 0.3 mg/dL (ref 0.2–1.2)
Total Protein: 7.2 g/dL (ref 6.0–8.3)

## 2022-07-14 LAB — LIPID PANEL
Cholesterol: 236 mg/dL — ABNORMAL HIGH (ref 0–200)
HDL: 66.9 mg/dL (ref >39.00)
LDL Cholesterol: 139 mg/dL — ABNORMAL HIGH (ref 0–99)
NonHDL: 168.75
Total CHOL/HDL Ratio: 4
Triglycerides: 149 mg/dL (ref 0.0–149.0)
VLDL: 29.8 mg/dL (ref 0.0–40.0)

## 2022-07-14 LAB — CBC
HCT: 37.1 % — ABNORMAL LOW (ref 39.0–52.0)
Hemoglobin: 12.2 g/dL — ABNORMAL LOW (ref 13.0–17.0)
MCHC: 33 g/dL (ref 30.0–36.0)
MCV: 94 fl (ref 78.0–100.0)
Platelets: 296 10*3/uL (ref 150.0–400.0)
RBC: 3.94 Mil/uL — ABNORMAL LOW (ref 4.22–5.81)
RDW: 13.3 % (ref 11.5–15.5)
WBC: 8.8 10*3/uL (ref 4.0–10.5)

## 2022-07-14 LAB — URINALYSIS, MICROSCOPIC ONLY

## 2022-07-14 LAB — TSH: TSH: 3.63 u[IU]/mL (ref 0.35–5.50)

## 2022-07-20 ENCOUNTER — Telehealth: Payer: Self-pay | Admitting: Nurse Practitioner

## 2022-07-20 NOTE — Telephone Encounter (Signed)
Ok to send letter

## 2022-07-20 NOTE — Telephone Encounter (Signed)
-----   Message from Vertis Kelch, [place] sent at 07/19/2022  4:21 PM EDT ----- Called and it stated that my call could not be completed as dialed. 3rd attempt ok to send letter?

## 2022-07-24 ENCOUNTER — Telehealth: Payer: Self-pay | Admitting: Nurse Practitioner

## 2022-07-24 NOTE — Telephone Encounter (Signed)
-----   Message from Donnamarie Poag, [place] sent at 07/24/2022  3:09 PM EDT -----  ----- Message ----- From: Eden Emms, NP Sent: 07/17/2022   7:40 AM EDT To: Birdena Jubilee  Notified via My Chart    See below about blood in urine and stool. If not will he be willing to have a redraw and me add on the iron study tests?  The 10-year ASCVD risk score (Arnett DK, et al., 2019) is: 3.2%   Values used to calculate the score:     Age: 42 years     Sex: Male     Is Non-Hispanic African American: No     Diabetic: No     Tobacco smoker: Yes     Systolic Blood Pressure: 112 mmHg     Is BP treated: No     HDL Cholesterol: 66.9 mg/dL     Total Cholesterol: 236 mg/dL

## 2022-07-24 NOTE — Telephone Encounter (Signed)
Ok to send letter

## 2022-07-25 ENCOUNTER — Telehealth: Payer: Self-pay

## 2022-07-25 NOTE — Telephone Encounter (Signed)
Sent out letter

## 2022-07-25 NOTE — Telephone Encounter (Signed)
Letter sent out 

## 2022-07-26 NOTE — Telephone Encounter (Signed)
Error

## 2022-08-10 ENCOUNTER — Telehealth: Payer: Self-pay

## 2022-08-10 NOTE — Telephone Encounter (Signed)
Letter sent out came back as not deliverable and not able to forward.

## 2023-09-25 ENCOUNTER — Other Ambulatory Visit: Payer: Self-pay | Admitting: Family

## 2023-09-25 ENCOUNTER — Other Ambulatory Visit: Payer: Self-pay | Admitting: Nurse Practitioner

## 2023-09-25 DIAGNOSIS — N529 Male erectile dysfunction, unspecified: Secondary | ICD-10-CM

## 2023-10-02 ENCOUNTER — Other Ambulatory Visit: Payer: Self-pay | Admitting: Nurse Practitioner

## 2023-10-02 DIAGNOSIS — N529 Male erectile dysfunction, unspecified: Secondary | ICD-10-CM

## 2023-10-16 ENCOUNTER — Other Ambulatory Visit: Payer: Self-pay | Admitting: Nurse Practitioner

## 2023-10-16 DIAGNOSIS — N529 Male erectile dysfunction, unspecified: Secondary | ICD-10-CM

## 2023-10-16 NOTE — Telephone Encounter (Signed)
 Copied from CRM [id number]. Topic: Clinical - Medication Refill >> Oct 16, 2023  1:10 PM Viola F wrote: Medication: sildenafil  (VIAGRA ) 50 MG tablet [[id number]]  Has the patient contacted their pharmacy? Yes (Agent: If no, request that the patient contact the pharmacy for the refill. If patient does not wish to contact the pharmacy document the reason why and proceed with request.) (Agent: If yes, when and what did the pharmacy advise?)  This is the patient's preferred pharmacy:    [facility] [address] ([address] [place] - [address] [place] ([place]) [place] [address] Phone: [phone] Fax: [phone]  Is this the correct pharmacy for this prescription? Yes If no, delete pharmacy and type the correct one.   Has the prescription been filled recently? Yes  Is the patient out of the medication? Yes  Has the patient been seen for an appointment in the last year OR does the patient have an upcoming appointment? Yes  Can we respond through MyChart? Yes  Agent: Please be advised that Rx refills may take up to 3 business days. We ask that you follow-up with your pharmacy.

## 2023-10-17 NOTE — Telephone Encounter (Signed)
 Needs to be seen sooner if he wants a refill on his medication

## 2023-10-18 ENCOUNTER — Ambulatory Visit: Payer: Self-pay

## 2023-10-18 NOTE — Telephone Encounter (Signed)
 Spoke to pt, r/s cpe for 10/22/23

## 2023-10-18 NOTE — Telephone Encounter (Signed)
 FYI Only or Action Required?: FYI only for provider.  Patient was last seen in primary care on 07/13/2022 by Wendee Lynwood HERO, NP.  Called Nurse Triage reporting Poison Ivy.  Symptoms began several days ago.  Interventions attempted: Nothing.  Symptoms are: gradually worsening.  Triage Disposition: See Physician Within 24 Hours  Patient/caregiver understands and will follow disposition?: Yes To UC   Copied from CRM #[id number]. Topic: Clinical - Red Word Triage >> Oct 18, 2023  1:53 PM Rosina BIRCH wrote: Reason for RMF:ejupzwu called stating he was cutting grass two days ago and he has the worst case of poison oak. Patient stated it feel like it is going all over his body and it started off like a rash. Patient has like ten blisters on both arms. Reason for Disposition  MODERATE to SEVERE itching (e.g., interferes with work, school, sleep, or other activities)  Answer Assessment - Initial Assessment Questions 1. APPEARANCE of RASH: What does the rash look like?      Red, welts, looks like poison oak and has blisters 2. LOCATION: Where is the rash located?  (e.g., face, genitals, hands, legs)     Arms, chest, neck and legs 3. SIZE: How large is the rash?      Large area 4. ONSET: When did the rash begin?      Two days ago 5. ITCHING: Does the rash itch? If Yes, ask: How bad is it?     Yes, severe 6. EXPOSURE:  How were you exposed to the plant (poison ivy, poison oak, sumac)  When were you exposed?  Note: Sometimes a poison ivy/oak/sumac rash does not appear for 2 to 3 weeks after exposure.      Poison oak 7. PAST HISTORY: Have you had a poison ivy rash before? If Yes, ask: How bad was it?     yes 8. OTHER SYMPTOMS: Do you have any other symptoms? (e.g., fever)      no 9. PREGNANCY: Is there any chance you are pregnant? When was your last menstrual period?     na  Protocols used: Poison Ivy - Oak - [facility]

## 2023-10-18 NOTE — Telephone Encounter (Signed)
 Agree with disposition.

## 2023-10-22 ENCOUNTER — Ambulatory Visit (INDEPENDENT_AMBULATORY_CARE_PROVIDER_SITE_OTHER): Payer: Self-pay | Admitting: Nurse Practitioner

## 2023-10-22 ENCOUNTER — Other Ambulatory Visit: Payer: Self-pay | Admitting: Nurse Practitioner

## 2023-10-22 ENCOUNTER — Encounter: Payer: Self-pay | Admitting: Nurse Practitioner

## 2023-10-22 VITALS — BP 108/72 | HR 75 | Temp 98.1°F | Ht 75.25 in | Wt 210.6 lb

## 2023-10-22 DIAGNOSIS — L255 Unspecified contact dermatitis due to plants, except food: Secondary | ICD-10-CM | POA: Diagnosis not present

## 2023-10-22 DIAGNOSIS — Z131 Encounter for screening for diabetes mellitus: Secondary | ICD-10-CM

## 2023-10-22 DIAGNOSIS — E782 Mixed hyperlipidemia: Secondary | ICD-10-CM

## 2023-10-22 DIAGNOSIS — N529 Male erectile dysfunction, unspecified: Secondary | ICD-10-CM

## 2023-10-22 DIAGNOSIS — Z23 Encounter for immunization: Secondary | ICD-10-CM | POA: Diagnosis not present

## 2023-10-22 DIAGNOSIS — Z720 Tobacco use: Secondary | ICD-10-CM | POA: Diagnosis not present

## 2023-10-22 DIAGNOSIS — Z0001 Encounter for general adult medical examination with abnormal findings: Secondary | ICD-10-CM

## 2023-10-22 DIAGNOSIS — G4709 Other insomnia: Secondary | ICD-10-CM

## 2023-10-22 DIAGNOSIS — Z1159 Encounter for screening for other viral diseases: Secondary | ICD-10-CM

## 2023-10-22 DIAGNOSIS — F411 Generalized anxiety disorder: Secondary | ICD-10-CM

## 2023-10-22 DIAGNOSIS — Z114 Encounter for screening for human immunodeficiency virus [HIV]: Secondary | ICD-10-CM

## 2023-10-22 LAB — CBC WITH DIFFERENTIAL/PLATELET
Basophils Absolute: 0 K/uL (ref 0.0–0.1)
Basophils Relative: 0.6 % (ref 0.0–3.0)
Eosinophils Absolute: 0.8 K/uL — ABNORMAL HIGH (ref 0.0–0.7)
Eosinophils Relative: 10.7 % — ABNORMAL HIGH (ref 0.0–5.0)
HCT: 39.9 % (ref 39.0–52.0)
Hemoglobin: 13.1 g/dL (ref 13.0–17.0)
Lymphocytes Relative: 20 % (ref 12.0–46.0)
Lymphs Abs: 1.5 K/uL (ref 0.7–4.0)
MCHC: 32.9 g/dL (ref 30.0–36.0)
MCV: 92.8 fl (ref 78.0–100.0)
Monocytes Absolute: 0.7 K/uL (ref 0.1–1.0)
Monocytes Relative: 9.3 % (ref 3.0–12.0)
Neutro Abs: 4.4 K/uL (ref 1.4–7.7)
Neutrophils Relative %: 59.4 % (ref 43.0–77.0)
Platelets: 304 K/uL (ref 150.0–400.0)
RBC: 4.3 Mil/uL (ref 4.22–5.81)
RDW: 13.6 % (ref 11.5–15.5)
WBC: 7.4 K/uL (ref 4.0–10.5)

## 2023-10-22 LAB — LIPID PANEL
Cholesterol: 297 mg/dL — ABNORMAL HIGH (ref 0–200)
HDL: 65.7 mg/dL (ref >39.00)
LDL Cholesterol: 199 mg/dL — ABNORMAL HIGH (ref 0–99)
NonHDL: 231.69
Total CHOL/HDL Ratio: 5
Triglycerides: 165 mg/dL — ABNORMAL HIGH (ref 0.0–149.0)
VLDL: 33 mg/dL (ref 0.0–40.0)

## 2023-10-22 LAB — COMPREHENSIVE METABOLIC PANEL WITH GFR
ALT: 14 U/L (ref 0–53)
AST: 15 U/L (ref 0–37)
Albumin: 4.7 g/dL (ref 3.5–5.2)
Alkaline Phosphatase: 62 U/L (ref 39–117)
BUN: 13 mg/dL (ref 6–23)
CO2: 26 meq/L (ref 19–32)
Calcium: 9.2 mg/dL (ref 8.4–10.5)
Chloride: 102 meq/L (ref 96–112)
Creatinine, Ser: 0.74 mg/dL (ref 0.40–1.50)
GFR: 111.03 mL/min (ref >60.00)
Glucose, Bld: 93 mg/dL (ref 70–99)
Potassium: 4.6 meq/L (ref 3.5–5.1)
Sodium: 138 meq/L (ref 135–145)
Total Bilirubin: 0.3 mg/dL (ref 0.2–1.2)
Total Protein: 7.2 g/dL (ref 6.0–8.3)

## 2023-10-22 LAB — URINALYSIS, MICROSCOPIC ONLY: RBC / HPF: NONE SEEN (ref 0–?)

## 2023-10-22 LAB — TSH: TSH: 2.55 u[IU]/mL (ref 0.35–5.50)

## 2023-10-22 LAB — HEMOGLOBIN A1C: Hgb A1c MFr Bld: 6 % (ref 4.6–6.5)

## 2023-10-22 MED ORDER — PREDNISONE 10 MG (21) PO TBPK: ORAL_TABLET | ORAL | 0 refills | Status: AC

## 2023-10-22 MED ORDER — SILDENAFIL CITRATE 100 MG PO TABS: 50.0000 mg | ORAL_TABLET | Freq: Every day | ORAL | 11 refills | Status: AC | PRN

## 2023-10-22 NOTE — Assessment & Plan Note (Signed)
 Patient currenlty maintaned on lifestyle modifications solely. Continue

## 2023-10-22 NOTE — Assessment & Plan Note (Signed)
 hx of the same. Pending lipid panel

## 2023-10-22 NOTE — Telephone Encounter (Signed)
 Copied from CRM [id number]. Topic: Clinical - Prescription Issue >> Oct 22, 2023 12:40 PM Deleta RAMAN wrote: Reason for CRM: patient is calling regarding issue with sildenafil  prescription please follow up with the patient at [phone]. Patient aware of call back time.

## 2023-10-22 NOTE — Patient Instructions (Signed)
 Nice to see you today  I will be in touch with the labs once I have them  Follow up with me in 1 year, sooner if you need me  Take the prednisone  with food and in the morning. Avoid NSAIDS like Ibuprofen, Motrin, Aleve, Naproxen, BC/Goody powders while on the prednisone 

## 2023-10-22 NOTE — Assessment & Plan Note (Signed)
 Discussed age-appropriate immunization and screening exams.  Did review patient's personal, surgical, social, family histories.  Patient is up-to-date on all age-appropriate vaccinations he would like update Prevnar 20 today.  Patient is too young for CRC screening or prostate cancer screening.  Patient was given information at discharge about preventative healthcare maintenance with anticipatory guidance.

## 2023-10-22 NOTE — Assessment & Plan Note (Signed)
 Patient is smoking 1 cigarette every other day. Encouraged cessation. Pending urine microscopy to r/o microscopic hematuria

## 2023-10-22 NOTE — Assessment & Plan Note (Signed)
 Maintained on lifestyle medications solely. No HI/SI/AVH. Stable.

## 2023-10-22 NOTE — Progress Notes (Signed)
 Established Patient Office Visit  Subjective   Patient ID: Hector Murphy, male    DOB: [birth year]  Age: 43 y.o. MRN: [id number]  Chief Complaint  Patient presents with   Annual Exam    HPI  Anxiety: Patient was maintained on BuSpar  5 mg twice daily. Has stopped taking it and feels mood is ok.  Insomnia: Patient was maintained on hydroxyzine  50 to 100 mg nightly as needed.  Patient stopped taking the medication and having no trouble going to sleep or feeling rested per his report  ED: Patient currently maintained on sildenafil  25 to 50 mg daily as needed sexual intercourse. Trouble with obtaining and maintaining an erection  for complete physical and follow up of chronic conditions.  Immunizations: -Tetanus: Completed in 2024 -Influenza: Out of season -Shingles: Too young -Pneumonia: Update today -COVID: Original series  Diet: Fair diet. He is eating 2 meals a day and some snacks. He is drinking gatorade and water  Exercise: No regular exercise. Walks a lot at work   [employer] exam: PRN  Dental exam: Completes semi-annually    Colonoscopy: Too young, currently average risk Lung Cancer Screening: N/A  PSA: Too young, currently average risk  Sleep:going to bed around 930 and will get up around 6-630. Feeling rested.    Rash: states that he got into posiion oak. He was doing yard work approx 1 week ago. States that he was ithcing and the rash started Wednesday and Thursday. States that he is using IVY rest.    Review of Systems  Constitutional:  Negative for chills and fever.  Respiratory:  Negative for shortness of breath.   Cardiovascular:  Negative for chest pain and leg swelling.  Gastrointestinal:  Negative for abdominal pain, blood in stool, constipation, diarrhea, nausea and vomiting.       BM daily   Genitourinary:  Negative for dysuria and hematuria.  Neurological:  Negative for dizziness, tingling and headaches.  Psychiatric/Behavioral:  Negative for  hallucinations and suicidal ideas.       Objective:     BP 108/72   Pulse 75   Temp 98.1 F (36.7 C) (Oral)   Ht 6' 3.25 (1.911 m)   Wt 210 lb 9.6 oz (95.5 kg)   SpO2 100%   BMI 26.15 kg/m  BP Readings from Last 3 Encounters:  10/22/23 108/72  07/13/22 112/66  06/14/21 140/82   Wt Readings from Last 3 Encounters:  10/22/23 210 lb 9.6 oz (95.5 kg)  07/13/22 188 lb 4 oz (85.4 kg)  06/14/21 200 lb 2 oz (90.8 kg)   SpO2 Readings from Last 3 Encounters:  10/22/23 100%  07/13/22 99%  06/14/21 99%      Physical Exam Vitals and nursing note reviewed.  Constitutional:      Appearance: Normal appearance.  HENT:     Right Ear: Tympanic membrane, ear canal and external ear normal.     Left Ear: Tympanic membrane, ear canal and external ear normal.     Mouth/Throat:     Mouth: Mucous membranes are moist.     Pharynx: Oropharynx is clear.  Eyes:     Extraocular Movements: Extraocular movements intact.     Pupils: Pupils are equal, round, and reactive to light.  Cardiovascular:     Rate and Rhythm: Normal rate and regular rhythm.     Pulses: Normal pulses.     Heart sounds: Normal heart sounds.  Pulmonary:     Effort: Pulmonary effort is normal.  Breath sounds: Normal breath sounds.  Abdominal:     General: Bowel sounds are normal. There is no distension.     Palpations: There is no mass.     Tenderness: There is no abdominal tenderness.     Hernia: No hernia is present.  Musculoskeletal:     Right lower leg: No edema.     Left lower leg: No edema.  Lymphadenopathy:     Cervical: No cervical adenopathy.  Skin:    General: Skin is warm.     Findings: Lesion and rash present.      Neurological:     General: No focal deficit present.     Mental Status: He is alert.     Deep Tendon Reflexes:     Reflex Scores:      Bicep reflexes are 2+ on the right side and 2+ on the left side.      Patellar reflexes are 2+ on the right side and 2+ on the left side.     Comments: Bilateral upper and lower extremity strength 5/5  Psychiatric:        Mood and Affect: Mood normal.        Behavior: Behavior normal.        Thought Content: Thought content normal.        Judgment: Judgment normal.      No results found for any visits on 10/22/23.    The 10-year ASCVD risk score (Arnett DK, et al., 2019) is: 3.2%    Assessment & Plan:   Problem List Items Addressed This Visit       Musculoskeletal and Integument   Rhus dermatitis   Patient can use otc antihistamines as needed. Continue use (IVY rest). Prednisone  taper written. Prednisone  precautions reviewed      Relevant Medications   predniSONE  (STERAPRED UNI-PAK 21 TAB) 10 MG (21) TBPK tablet     Other   Tobacco use   Patient is smoking 1 cigarette every other day. Encouraged cessation. Pending urine microscopy to r/o microscopic hematuria       Relevant Orders   Urine Microscopic   Mixed hyperlipidemia   hx of the same. Pending lipid panel       Relevant Medications   sildenafil  (VIAGRA ) 100 MG tablet   Other Relevant Orders   Hemoglobin A1c   Lipid panel   Erectile dysfunction   Patient currently maintained on sildenafil  50 to 100 mg daily.  We will increase the milligram from 50 to 100 mg as patient has not taken up to 50 mg in the past.  Continue medication as prescribed refill provided today      Relevant Medications   sildenafil  (VIAGRA ) 100 MG tablet   Other insomnia   Patient currenlty maintaned on lifestyle modifications solely. Continue       Relevant Orders   TSH   Encounter for general adult medical examination with abnormal findings - Primary   Discussed age-appropriate immunization and screening exams.  Did review patient's personal, surgical, social, family histories.  Patient is up-to-date on all age-appropriate vaccinations he would like update Prevnar 20 today.  Patient is too young for CRC screening or prostate cancer screening.  Patient was given information  at discharge about preventative healthcare maintenance with anticipatory guidance.      Relevant Orders   CBC with Differential/Platelet   Comprehensive metabolic panel with GFR   GAD (generalized anxiety disorder)   Maintained on lifestyle medications solely. No HI/SI/AVH. Stable.  Relevant Orders   TSH   Other Visit Diagnoses       Encounter for hepatitis C screening test for low risk patient       Relevant Orders   Hepatitis C antibody     Encounter for screening for HIV       Relevant Orders   HIV antibody (with reflex)     Screening for diabetes mellitus       Relevant Orders   Hemoglobin A1c     Need for pneumococcal 20-valent conjugate vaccination       Relevant Orders   Pneumococcal conjugate vaccine 20-valent (Prevnar 20) (Completed)       Return in about 1 year (around 10/21/2024) for CPE and Labs.    Adina Crandall, NP

## 2023-10-22 NOTE — Assessment & Plan Note (Signed)
 Patient can use otc antihistamines as needed. Continue use (IVY rest). Prednisone  taper written. Prednisone  precautions reviewed

## 2023-10-22 NOTE — Assessment & Plan Note (Signed)
 Patient currently maintained on sildenafil  50 to 100 mg daily.  We will increase the milligram from 50 to 100 mg as patient has not taken up to 50 mg in the past.  Continue medication as prescribed refill provided today

## 2023-10-23 LAB — HIV ANTIBODY (ROUTINE TESTING W REFLEX): HIV 1&2 Ab, 4th Generation: NONREACTIVE

## 2023-10-23 LAB — HEPATITIS C ANTIBODY: Hepatitis C Ab: NONREACTIVE

## 2023-10-25 ENCOUNTER — Ambulatory Visit: Payer: Self-pay | Admitting: Nurse Practitioner

## 2023-10-25 DIAGNOSIS — E782 Mixed hyperlipidemia: Secondary | ICD-10-CM

## 2023-10-26 MED ORDER — ROSUVASTATIN CALCIUM 10 MG PO TABS: 10.0000 mg | ORAL_TABLET | Freq: Every day | ORAL | 0 refills | Status: DC

## 2023-10-26 NOTE — Telephone Encounter (Signed)
-----   Message from [facility] T sent at 10/25/2023  4:35 PM EDT ----- Called patient reviewed all information and repeated back to me. Will call if any questions.   Pt is agreeable to start medication and is scheduled for fasting labs for 01/29/24 @830am   ----- Message ----- From: Wendee Lynwood HERO, NP Sent: 10/25/2023   2:06 PM EDT To: Wendee Gunnels  Notified via My Chart   If he agrees with cholesterol medication he will need a 3 month fasting lab appt  The 10-year ASCVD risk score (Arnett DK, et al., 2019) is: 5%   Values used to calculate the score:     Age: 43 years     Clincally relevant sex: Male     Is Non-Hispanic African American: No     Diabetic: No     Tobacco smoker: Yes     Systolic Blood Pressure: 108 mmHg     Is BP treated: No     HDL Cholesterol: 65.7 mg/dL     Total Cholesterol: 297 mg/dL  ----- Message ----- From: Interface, [facility] Sent: 10/22/2023   3:56 PM EDT To: Lynwood HERO Wendee, NP

## 2023-10-29 NOTE — Telephone Encounter (Signed)
 Refill was provided at [facility] on 10/22/23

## 2024-01-03 ENCOUNTER — Encounter: Payer: Self-pay | Admitting: Nurse Practitioner

## 2024-01-29 ENCOUNTER — Other Ambulatory Visit

## 2024-01-29 DIAGNOSIS — E782 Mixed hyperlipidemia: Secondary | ICD-10-CM

## 2024-01-29 LAB — LIPID PANEL
Cholesterol: 187 mg/dL (ref 0–200)
HDL: 64.2 mg/dL (ref >39.00)
LDL Cholesterol: 105 mg/dL — ABNORMAL HIGH (ref 0–99)
NonHDL: 123.13
Total CHOL/HDL Ratio: 3
Triglycerides: 91 mg/dL (ref 0.0–149.0)
VLDL: 18.2 mg/dL (ref 0.0–40.0)

## 2024-01-29 LAB — HEPATIC FUNCTION PANEL
ALT: 14 U/L (ref 0–53)
AST: 14 U/L (ref 0–37)
Albumin: 4.4 g/dL (ref 3.5–5.2)
Alkaline Phosphatase: 49 U/L (ref 39–117)
Bilirubin, Direct: 0 mg/dL (ref 0.0–0.3)
Total Bilirubin: 0.3 mg/dL (ref 0.2–1.2)
Total Protein: 6.9 g/dL (ref 6.0–8.3)

## 2024-02-01 ENCOUNTER — Ambulatory Visit: Payer: Self-pay | Admitting: Nurse Practitioner

## 2024-02-01 DIAGNOSIS — E782 Mixed hyperlipidemia: Secondary | ICD-10-CM

## 2024-02-01 MED ORDER — ROSUVASTATIN CALCIUM 10 MG PO TABS: 10.0000 mg | ORAL_TABLET | Freq: Every day | ORAL | 1 refills | Status: AC
